# Patient Record
Sex: Male | Born: 1960 | Race: White | Hispanic: No | Marital: Married
Health system: Southern US, Community
[De-identification: ages and names within clinical notes are randomized; demographics above are authoritative.]

---

## 2006-10-31 ENCOUNTER — Ambulatory Visit: Payer: Self-pay | Admitting: Internal Medicine

## 2007-02-20 ENCOUNTER — Ambulatory Visit: Payer: Self-pay | Admitting: Internal Medicine

## 2008-02-11 ENCOUNTER — Ambulatory Visit: Payer: Self-pay | Admitting: Physician Assistant

## 2009-01-08 ENCOUNTER — Ambulatory Visit: Payer: Self-pay

## 2009-02-08 ENCOUNTER — Ambulatory Visit: Payer: Self-pay | Admitting: Urology

## 2009-03-04 ENCOUNTER — Ambulatory Visit: Payer: Self-pay | Admitting: Urology

## 2009-03-17 ENCOUNTER — Ambulatory Visit: Payer: Self-pay | Admitting: Urology

## 2009-05-03 ENCOUNTER — Ambulatory Visit: Payer: Self-pay | Admitting: Urology

## 2011-03-06 ENCOUNTER — Ambulatory Visit: Payer: Self-pay | Admitting: Urology

## 2011-03-08 ENCOUNTER — Ambulatory Visit: Payer: Self-pay | Admitting: Urology

## 2011-03-16 ENCOUNTER — Ambulatory Visit: Payer: Self-pay | Admitting: Urology

## 2011-03-31 ENCOUNTER — Ambulatory Visit: Payer: Self-pay | Admitting: Urology

## 2011-05-01 ENCOUNTER — Ambulatory Visit: Payer: Self-pay | Admitting: Urology

## 2011-09-06 ENCOUNTER — Ambulatory Visit: Payer: Self-pay | Admitting: Urology

## 2011-11-09 ENCOUNTER — Ambulatory Visit: Payer: Self-pay | Admitting: Urology

## 2013-03-25 ENCOUNTER — Ambulatory Visit: Payer: Self-pay | Admitting: Urology

## 2014-06-19 IMAGING — CR DG ABDOMEN 1V
1 series · 1 of 1 positions shown · non-contrast
Comparison: none

REASON FOR EXAM: Calculus
COMMENTS:

[t abdomen supine]
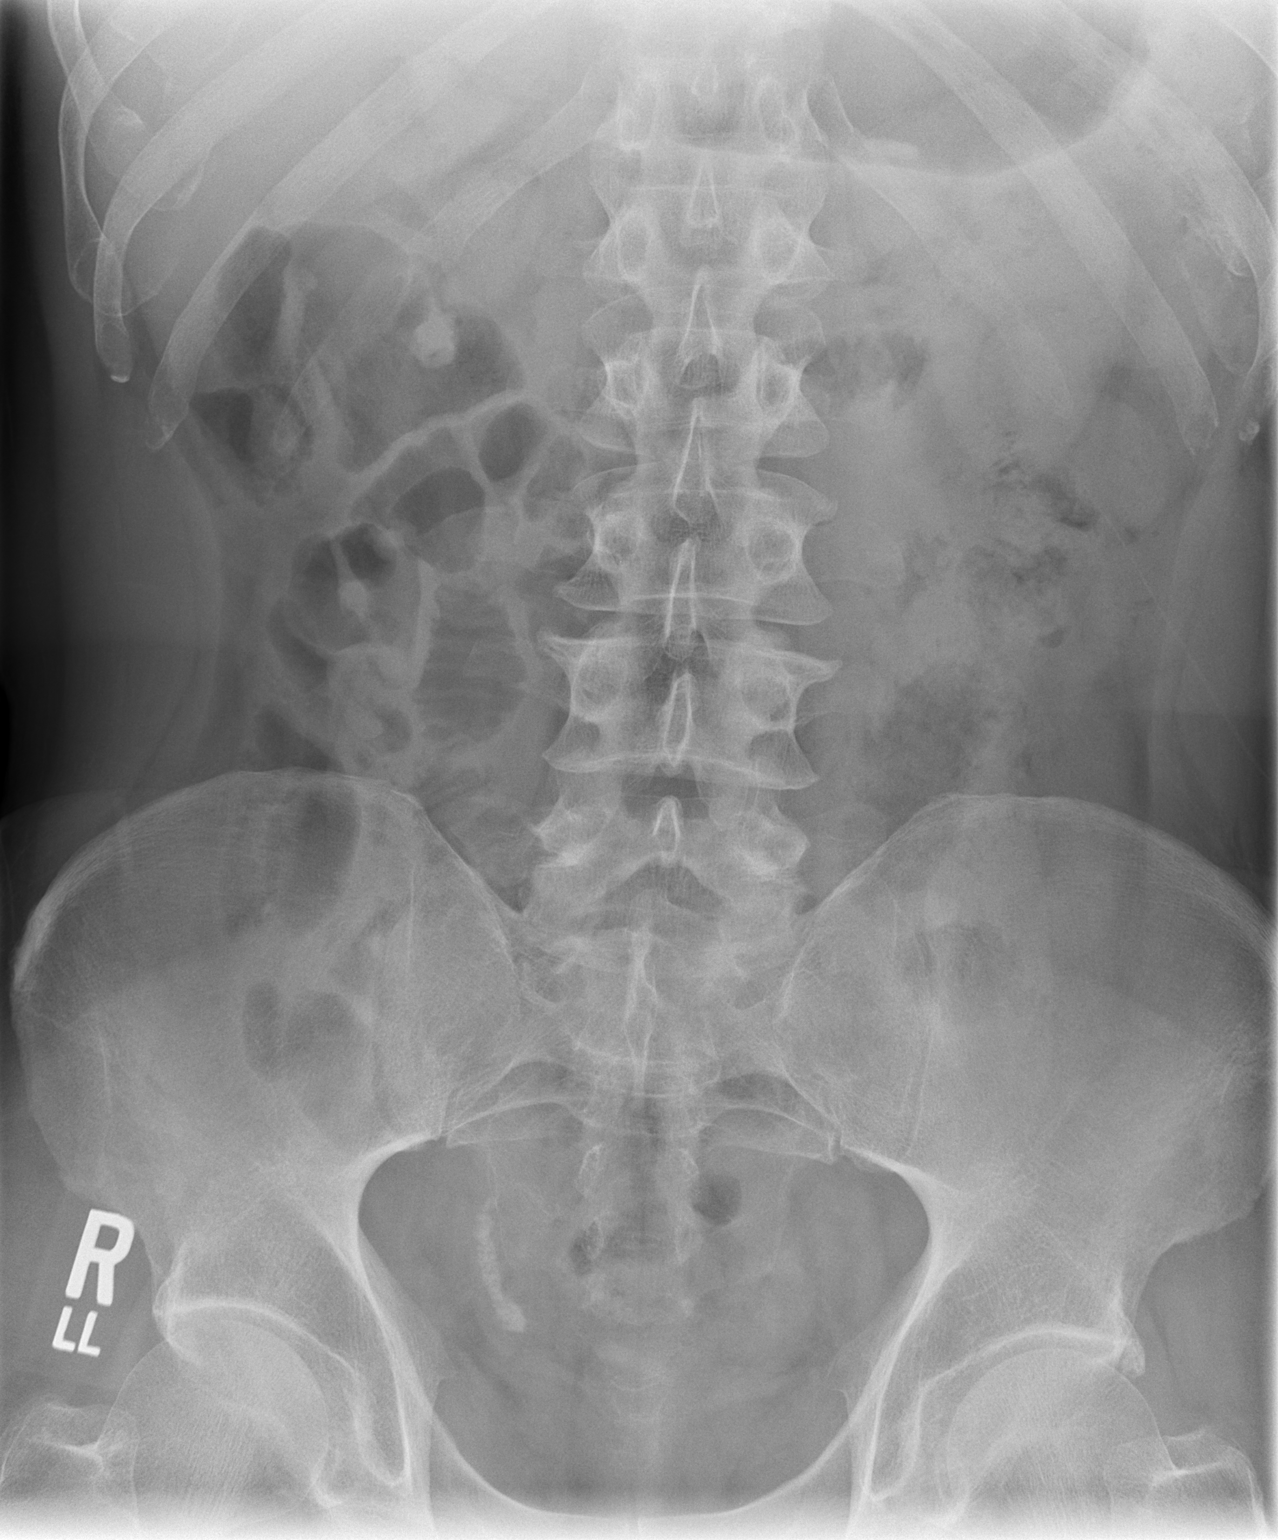

[1 of 1 positions shown; findings below may reference images not displayed]

PROCEDURE:     DXR - DXR KIDNEY URETER BLADDER  - November 09, 2011  [DATE]

RESULT:     Comparison is made to study September 06, 2011.

There is a coarse radiodensity projecting over the right renal pelvis. No
definite stones are identified over the left kidney. There is a linear
density noted near the ureterovesical junction on the right consistent with
Steinstrasse. A mid ureteral stone demonstrated previously on the right is
not evident.
IMPRESSION: There is Steinstrasse now present at the right
ureterovesical junction. A mid right ureteral stone is no longer
demonstrated. There is a radiodensity that projects in the region of the
renal pelvis on the right which has been previously demonstrated. Further
interpretation is deferred to Dr. Yung.

## 2019-08-13 ENCOUNTER — Other Ambulatory Visit: Payer: Self-pay

## 2019-08-13 ENCOUNTER — Ambulatory Visit: Payer: Self-pay | Attending: Internal Medicine

## 2019-08-13 DIAGNOSIS — Z23 Encounter for immunization: Secondary | ICD-10-CM

## 2019-08-13 NOTE — Progress Notes (Signed)
   [facility] Vaccination [facility]  Name:  Jason Berger    MRN: [id number] DOB: 29-Dec-1960  08/13/2019  [name] was observed post Covid-19 immunization for 15 minutes without incident. He was provided with Vaccine Information Sheet and instruction to access the V-Safe system.   [name] was instructed to call 911 with any severe reactions post vaccine: Marland Kitchen Difficulty breathing  . Swelling of face and throat  . A fast heartbeat  . A bad rash all over body  . Dizziness and weakness   Immunizations Administered    Name Date Dose VIS Date Route   Pfizer COVID-19 Vaccine 08/13/2019  4:42 PM 0.3 mL 06/11/2018 Intramuscular   Manufacturer: [employer], [employer]   Lot: [id number]   NDC: [address]-9

## 2019-09-02 ENCOUNTER — Ambulatory Visit: Payer: Self-pay | Attending: Internal Medicine

## 2019-09-02 DIAGNOSIS — Z23 Encounter for immunization: Secondary | ICD-10-CM

## 2019-09-02 NOTE — Progress Notes (Signed)
   Covid-19 Vaccination Clinic  Name:  Jason Berger    MRN: [id number] DOB: 1961/04/08  09/02/2019  Mr. Jason Berger was observed post Covid-19 immunization for 15 minutes without incident. He was provided with Vaccine Information Sheet and instruction to access the V-Safe system.   Mr. Jason Berger was instructed to call 911 with any severe reactions post vaccine: Marland Kitchen Difficulty breathing  . Swelling of face and throat  . A fast heartbeat  . A bad rash all over body  . Dizziness and weakness   Immunizations Administered    Name Date Dose VIS Date Route   Pfizer COVID-19 Vaccine 09/02/2019  1:30 PM 0.3 mL 06/11/2018 Intramuscular   Manufacturer: [employer], [employer]   Lot: [id number]   NDC: [address]-7
